# Patient Record
Sex: Female | Born: 1945 | Race: White | Hispanic: No
Health system: Southern US, Community
[De-identification: ages and names within clinical notes are randomized; demographics above are authoritative.]

---

## 2006-03-11 ENCOUNTER — Ambulatory Visit (HOSPITAL_COMMUNITY): Admission: RE | Admit: 2006-03-11 | Discharge: 2006-03-12 | Payer: Self-pay | Admitting: Ophthalmology

## 2007-12-22 IMAGING — CR DG CHEST 2V
2 series · 2 of 2 positions shown · non-contrast
Comparison: None.

CLINICAL DATA: 60-year-old male, vitreous hemorrhage.  Preop chest x-ray. 
CHEST - 2 VIEW:

[view not recorded (1 of 2)]
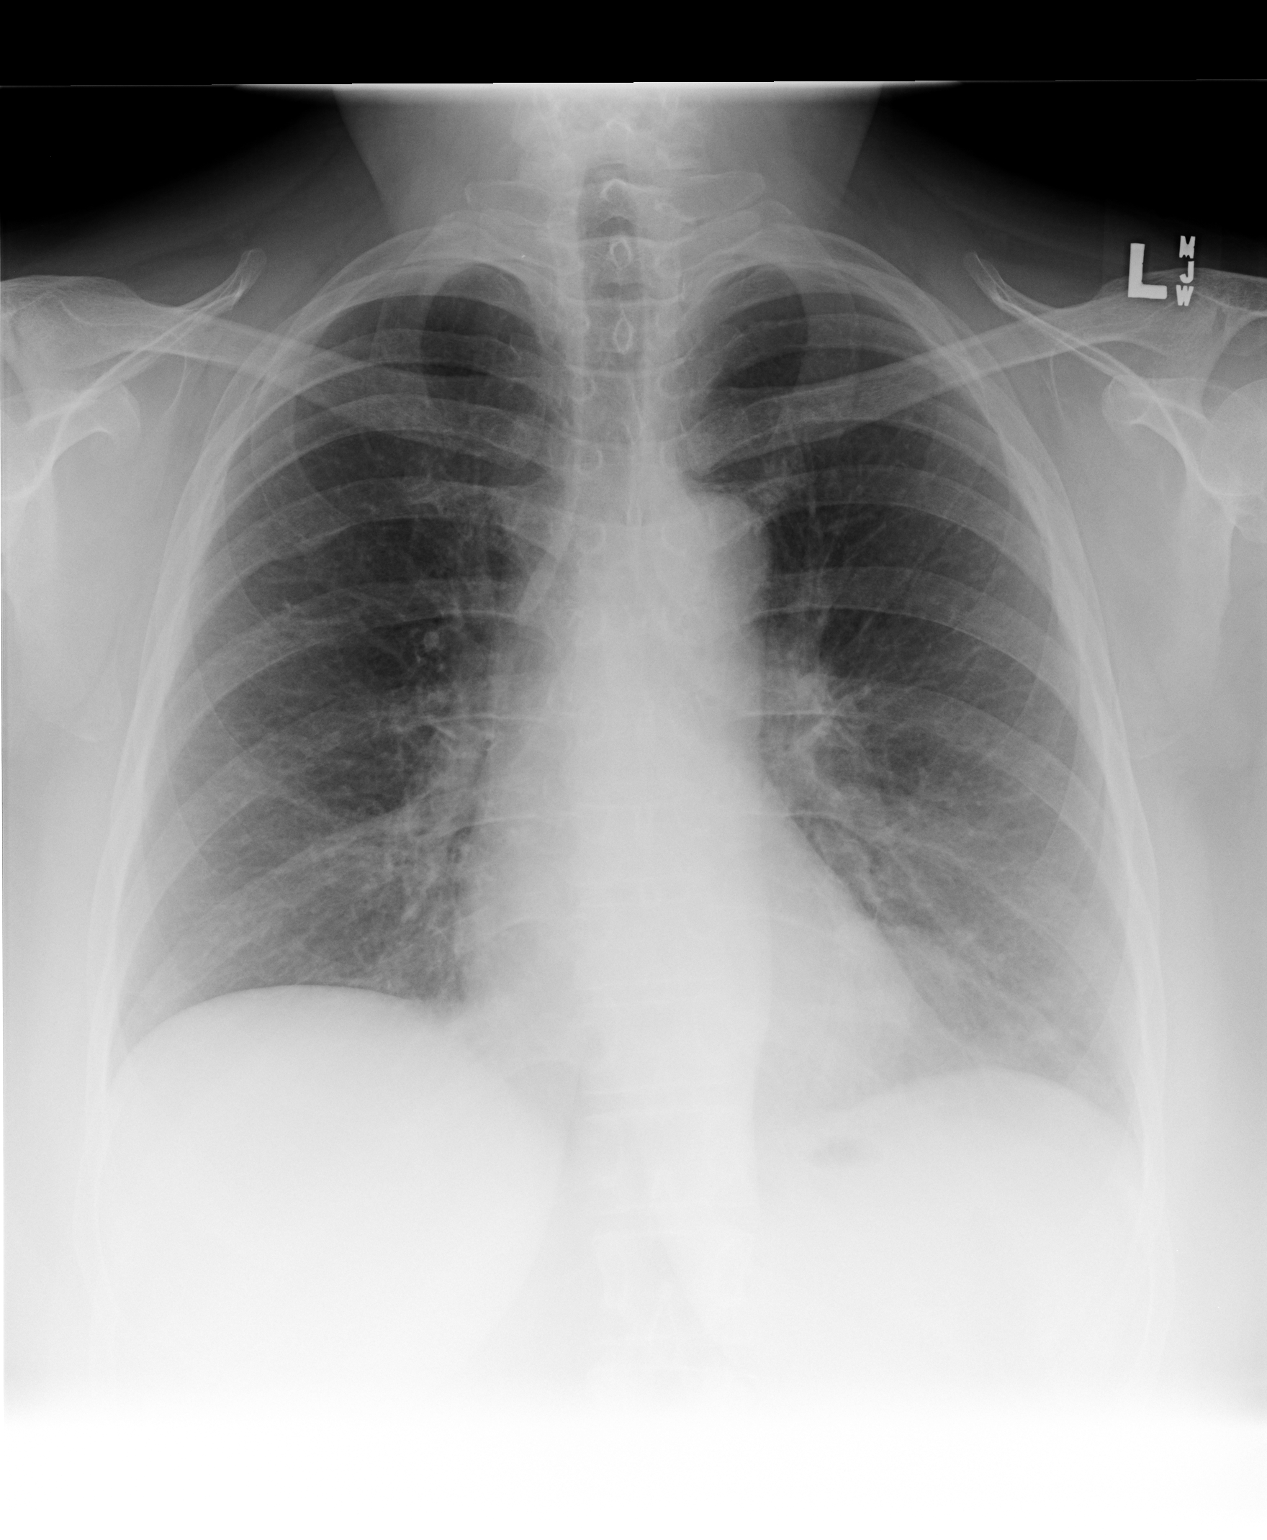

[view not recorded (2 of 2)]
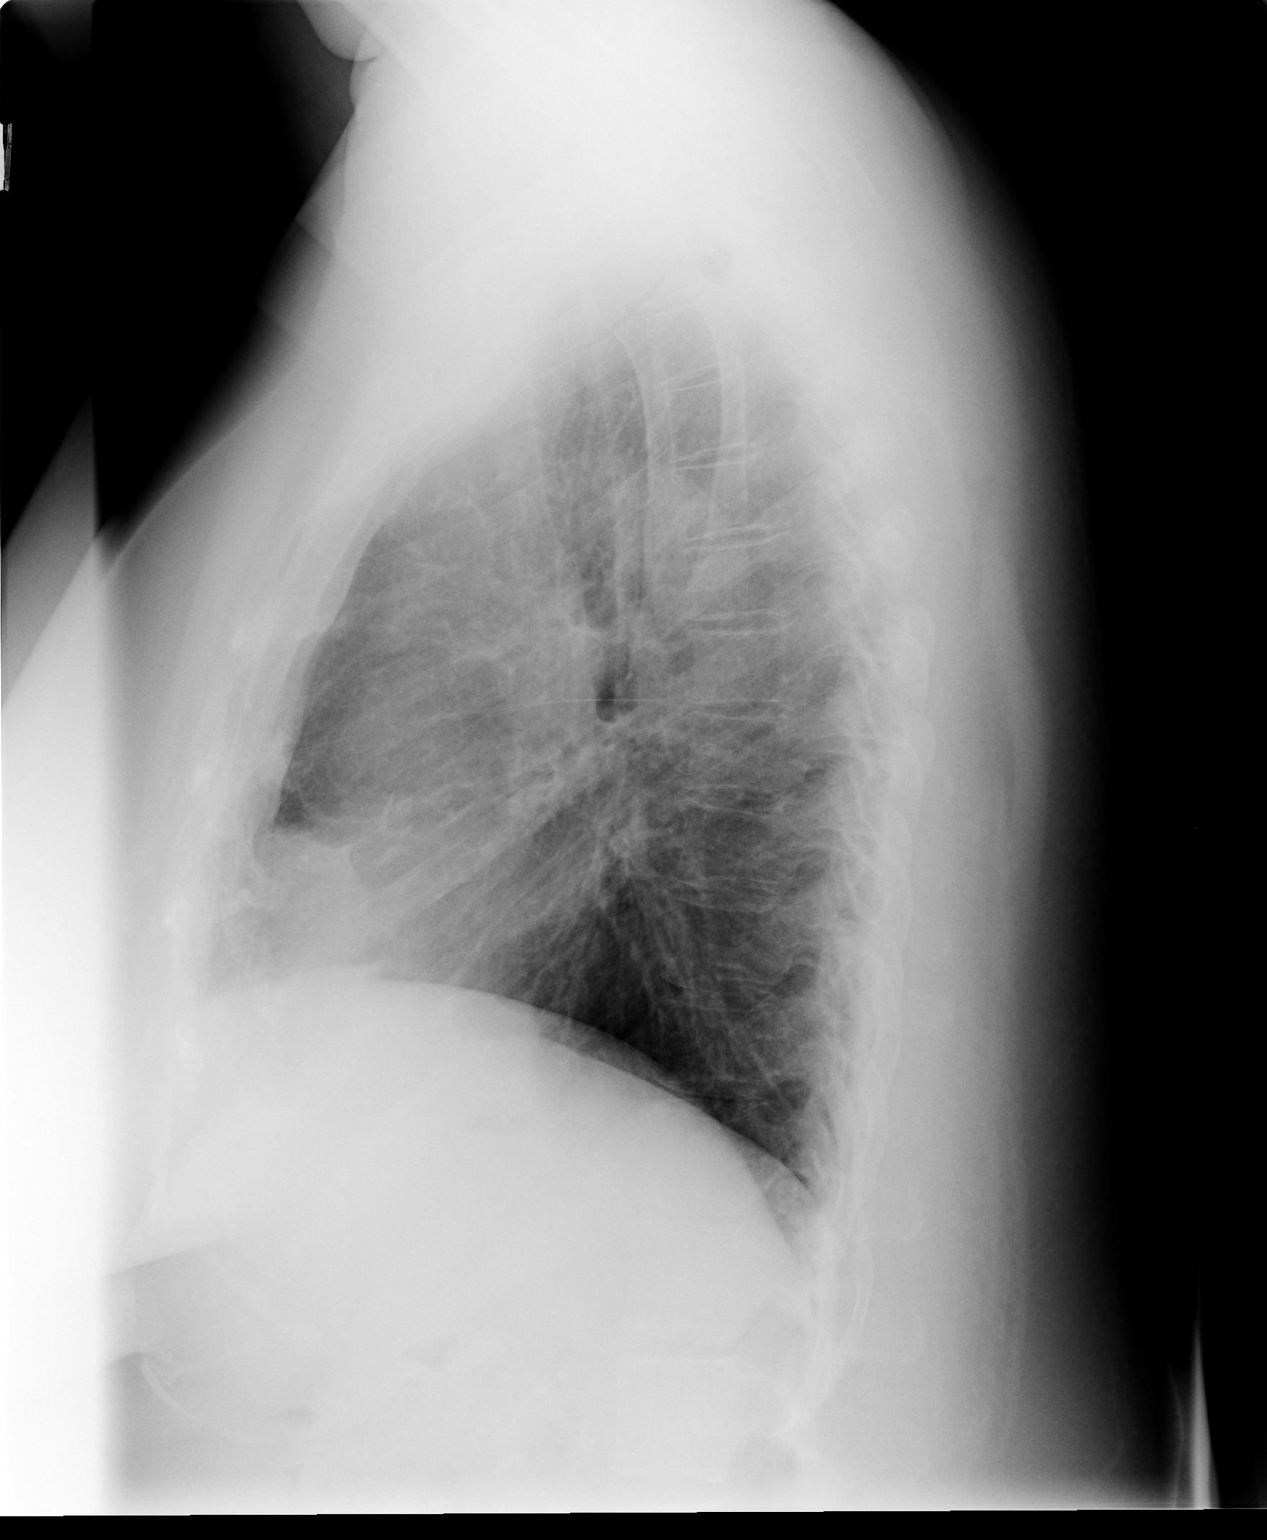

[2 of 2 positions shown; findings below may reference images not displayed]

FINDINGS: Cardiopericardial silhouette is within normal limits for size.  There is mild prominence of the interstitium throughout.  This is slightly more prominent at the left base.  There is a subtle nodular density seen best on the PA view and not well appreciated on the lateral view.  While this could be associated with atelectasis, a small nodule is not excluded.  Comparison with prior chest radiographs or followup CT may be of use for further evaluation as clinically indicated.
IMPRESSION: 1.  Interstitial changes, most prominent at the bases, is likely chronic. 
2.  Nodular density at the left base.  Comparison with prior radiographs or followup chest CT is recommended for further evaluation as clinically indicated.

## 2013-11-04 DEATH — deceased

## 2019-03-29 ENCOUNTER — Telehealth: Payer: Self-pay

## 2019-03-29 NOTE — Telephone Encounter (Signed)
TC to Pt returning message from my chart in reference to getting Covid 19 vaccination. Per Dr. Andrey Spearman to get the vaccination. Pt verbalized understanding.
